# Patient Record
Sex: Female | Born: 1945
Health system: Southern US, Community
[De-identification: ages and names within clinical notes are randomized; demographics above are authoritative.]

## PROBLEM LIST (undated history)

## (undated) DIAGNOSIS — Z85828 Personal history of other malignant neoplasm of skin: Secondary | ICD-10-CM

## (undated) DIAGNOSIS — J302 Other seasonal allergic rhinitis: Secondary | ICD-10-CM

## (undated) DIAGNOSIS — C50919 Malignant neoplasm of unspecified site of unspecified female breast: Secondary | ICD-10-CM

## (undated) DIAGNOSIS — M1990 Unspecified osteoarthritis, unspecified site: Secondary | ICD-10-CM

## (undated) HISTORY — DX: Unspecified osteoarthritis, unspecified site: M19.90

## (undated) HISTORY — DX: Other seasonal allergic rhinitis: J30.2

## (undated) HISTORY — DX: Personal history of other malignant neoplasm of skin: Z85.828

---

## 1991-07-17 DIAGNOSIS — C50919 Malignant neoplasm of unspecified site of unspecified female breast: Secondary | ICD-10-CM

## 1991-07-17 HISTORY — PX: BREAST BIOPSY: SHX20

## 1991-07-17 HISTORY — DX: Malignant neoplasm of unspecified site of unspecified female breast: C50.919

## 2004-05-30 ENCOUNTER — Ambulatory Visit: Payer: Self-pay | Admitting: Unknown Physician Specialty

## 2005-06-15 ENCOUNTER — Ambulatory Visit: Payer: Self-pay | Admitting: Unknown Physician Specialty

## 2006-06-28 ENCOUNTER — Ambulatory Visit: Payer: Self-pay | Admitting: Unknown Physician Specialty

## 2007-06-30 ENCOUNTER — Ambulatory Visit: Payer: Self-pay | Admitting: Unknown Physician Specialty

## 2008-06-30 ENCOUNTER — Ambulatory Visit: Payer: Self-pay | Admitting: Unknown Physician Specialty

## 2009-05-12 ENCOUNTER — Ambulatory Visit: Payer: Self-pay | Admitting: Unknown Physician Specialty

## 2009-07-04 ENCOUNTER — Ambulatory Visit: Payer: Self-pay | Admitting: Unknown Physician Specialty

## 2010-07-05 ENCOUNTER — Ambulatory Visit: Payer: Self-pay | Admitting: Unknown Physician Specialty

## 2011-07-12 ENCOUNTER — Ambulatory Visit: Payer: Self-pay | Admitting: Internal Medicine

## 2012-07-14 ENCOUNTER — Ambulatory Visit: Payer: Self-pay | Admitting: Internal Medicine

## 2012-07-16 HISTORY — PX: MASTECTOMY: SHX3

## 2012-07-18 ENCOUNTER — Ambulatory Visit: Payer: Self-pay | Admitting: Internal Medicine

## 2012-09-01 ENCOUNTER — Ambulatory Visit: Payer: Self-pay | Admitting: Surgery

## 2012-09-11 ENCOUNTER — Ambulatory Visit: Payer: Self-pay | Admitting: Surgery

## 2012-09-11 DIAGNOSIS — R002 Palpitations: Secondary | ICD-10-CM

## 2012-09-18 ENCOUNTER — Ambulatory Visit: Payer: Self-pay | Admitting: Surgery

## 2012-09-22 LAB — PATHOLOGY REPORT

## 2012-10-09 ENCOUNTER — Ambulatory Visit: Payer: Self-pay | Admitting: Internal Medicine

## 2012-10-14 ENCOUNTER — Ambulatory Visit: Payer: Self-pay | Admitting: Internal Medicine

## 2013-07-15 ENCOUNTER — Ambulatory Visit: Payer: Self-pay | Admitting: Surgery

## 2014-07-20 ENCOUNTER — Ambulatory Visit: Payer: Self-pay | Admitting: Family Medicine

## 2014-11-05 NOTE — Op Note (Signed)
PATIENT NAME:  Elizabeth Reeves, Elizabeth Reeves MR#:  [id number] DATE OF BIRTH:  [birth year]  DATE OF PROCEDURE:  09/18/2012  PREOPERATIVE DIAGNOSIS: Carcinoma of the left breast.   POSTOPERATIVE DIAGNOSIS: Carcinoma of the left breast.   PROCEDURE: Left mastectomy.  SURGEON: Rochel Brome, MD     ANESTHESIA: General.   INDICATIONS: This 69 year old female has a past history of carcinoma of the left breast with partial mastectomy and radiation.  She recently had a mammogram depicting microcalcifications in the upper outer quadrant of the left breast.  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   DESCRIPTION OF PROCEDURE: The patient was placed on the operating table in the supine position under general anesthesia. The left arm was placed on the lateral arm support. The left breast was repaired with ChloraPrep, draped in sterile manner.   Elliptical excision was made, carried above and below the breast oriented slightly oblique and transversely. The skin flaps were retracted with silk sutures. The skin and subcutaneous flaps were raised superiorly, medially, inferiorly and laterally, and the breast was elevated off the underlying deep fascia with the use of electrocautery.  It is noted that one bleeding point was suture ligated with 3-0 chromic. Dissection was carried out to include the axillary tail. There was no grossly palpable mass within the axilla. The lateral end of the skin ellipse was tagged with a stitch, and the specimen was submitted for pathology. The wound was inspected, hemostasis was intact.  Two Blake drains were inserted through separate inferior stab wounds, one placed along the anterior chest wall and one extended up into the axilla. These were sutured to the skin with 3-0 nylon. Next, the incision was closed with a running 4-0 Monocryl subcuticular suture and Dermabond. Tegaderm was placed over the entrance point of the drains, and also  the drains were secured with benzoin and 2-inch silk tape. The drains were activated.  There was only scant serous drainage.   The patient tolerated the procedure satisfactorily and was then prepared for transfer to the recovery room.    ____________________________ Lenna Sciara. Rochel Brome, MD jws:cb D: 09/18/2012 09:31:44 ET T: 09/18/2012 09:46:48 ET JOB#: [id number]  cc: Loreli Dollar, MD, <Dictator> Loreli Dollar MD ELECTRONICALLY SIGNED 09/20/2012 14:48

## 2015-07-27 ENCOUNTER — Other Ambulatory Visit: Payer: Self-pay | Admitting: Surgery

## 2015-07-27 DIAGNOSIS — Z1231 Encounter for screening mammogram for malignant neoplasm of breast: Secondary | ICD-10-CM

## 2015-08-11 ENCOUNTER — Ambulatory Visit
Admission: RE | Admit: 2015-08-11 | Discharge: 2015-08-11 | Disposition: A | Discharge status: Home / Self Care | Payer: PPO | Source: Ambulatory Visit | Attending: Surgery | Admitting: Surgery

## 2015-08-11 DIAGNOSIS — Z1231 Encounter for screening mammogram for malignant neoplasm of breast: Secondary | ICD-10-CM | POA: Diagnosis not present

## 2015-08-11 DIAGNOSIS — Z9012 Acquired absence of left breast and nipple: Secondary | ICD-10-CM | POA: Insufficient documentation

## 2015-08-11 HISTORY — DX: Malignant neoplasm of unspecified site of unspecified female breast: C50.919

## 2015-08-22 DIAGNOSIS — Z853 Personal history of malignant neoplasm of breast: Secondary | ICD-10-CM | POA: Diagnosis not present

## 2015-10-27 DIAGNOSIS — Z4432 Encounter for fitting and adjustment of external left breast prosthesis: Secondary | ICD-10-CM | POA: Diagnosis not present

## 2015-10-27 DIAGNOSIS — C50112 Malignant neoplasm of central portion of left female breast: Secondary | ICD-10-CM | POA: Diagnosis not present

## 2015-12-23 DIAGNOSIS — C50112 Malignant neoplasm of central portion of left female breast: Secondary | ICD-10-CM | POA: Diagnosis not present

## 2015-12-23 DIAGNOSIS — Z4432 Encounter for fitting and adjustment of external left breast prosthesis: Secondary | ICD-10-CM | POA: Diagnosis not present

## 2016-01-06 DIAGNOSIS — C50112 Malignant neoplasm of central portion of left female breast: Secondary | ICD-10-CM | POA: Diagnosis not present

## 2016-01-06 DIAGNOSIS — Z4432 Encounter for fitting and adjustment of external left breast prosthesis: Secondary | ICD-10-CM | POA: Diagnosis not present

## 2016-05-18 DIAGNOSIS — E7800 Pure hypercholesterolemia, unspecified: Secondary | ICD-10-CM | POA: Diagnosis not present

## 2016-05-18 DIAGNOSIS — Z23 Encounter for immunization: Secondary | ICD-10-CM | POA: Diagnosis not present

## 2016-05-18 DIAGNOSIS — Z0000 Encounter for general adult medical examination without abnormal findings: Secondary | ICD-10-CM | POA: Diagnosis not present

## 2016-05-18 DIAGNOSIS — Z79899 Other long term (current) drug therapy: Secondary | ICD-10-CM | POA: Diagnosis not present

## 2016-05-21 DIAGNOSIS — I8393 Asymptomatic varicose veins of bilateral lower extremities: Secondary | ICD-10-CM | POA: Diagnosis not present

## 2016-05-21 DIAGNOSIS — D1800 Hemangioma unspecified site: Secondary | ICD-10-CM | POA: Diagnosis not present

## 2016-05-21 DIAGNOSIS — Z85828 Personal history of other malignant neoplasm of skin: Secondary | ICD-10-CM | POA: Diagnosis not present

## 2016-05-21 DIAGNOSIS — D229 Melanocytic nevi, unspecified: Secondary | ICD-10-CM | POA: Diagnosis not present

## 2016-05-21 DIAGNOSIS — L821 Other seborrheic keratosis: Secondary | ICD-10-CM | POA: Diagnosis not present

## 2016-05-21 DIAGNOSIS — L570 Actinic keratosis: Secondary | ICD-10-CM | POA: Diagnosis not present

## 2016-05-21 DIAGNOSIS — Z1283 Encounter for screening for malignant neoplasm of skin: Secondary | ICD-10-CM | POA: Diagnosis not present

## 2016-05-21 DIAGNOSIS — L820 Inflamed seborrheic keratosis: Secondary | ICD-10-CM | POA: Diagnosis not present

## 2016-05-21 DIAGNOSIS — L578 Other skin changes due to chronic exposure to nonionizing radiation: Secondary | ICD-10-CM | POA: Diagnosis not present

## 2016-05-21 DIAGNOSIS — L918 Other hypertrophic disorders of the skin: Secondary | ICD-10-CM | POA: Diagnosis not present

## 2016-05-21 DIAGNOSIS — L814 Other melanin hyperpigmentation: Secondary | ICD-10-CM | POA: Diagnosis not present

## 2016-05-31 DIAGNOSIS — J040 Acute laryngitis: Secondary | ICD-10-CM | POA: Diagnosis not present

## 2016-07-23 ENCOUNTER — Other Ambulatory Visit: Payer: Self-pay | Admitting: Surgery

## 2016-07-23 DIAGNOSIS — Z1231 Encounter for screening mammogram for malignant neoplasm of breast: Secondary | ICD-10-CM

## 2016-07-30 DIAGNOSIS — H2513 Age-related nuclear cataract, bilateral: Secondary | ICD-10-CM | POA: Diagnosis not present

## 2016-08-23 ENCOUNTER — Ambulatory Visit: Payer: Medicare Other

## 2016-08-24 ENCOUNTER — Other Ambulatory Visit: Payer: Self-pay | Admitting: Surgery

## 2016-08-24 ENCOUNTER — Ambulatory Visit
Admission: RE | Admit: 2016-08-24 | Discharge: 2016-08-24 | Disposition: A | Discharge status: Home / Self Care | Payer: PPO | Source: Ambulatory Visit | Attending: Surgery | Admitting: Surgery

## 2016-08-24 DIAGNOSIS — Z1231 Encounter for screening mammogram for malignant neoplasm of breast: Secondary | ICD-10-CM | POA: Insufficient documentation

## 2016-09-17 DIAGNOSIS — Z853 Personal history of malignant neoplasm of breast: Secondary | ICD-10-CM | POA: Diagnosis not present

## 2016-11-29 DIAGNOSIS — Z853 Personal history of malignant neoplasm of breast: Secondary | ICD-10-CM | POA: Diagnosis not present

## 2016-11-29 DIAGNOSIS — R102 Pelvic and perineal pain: Secondary | ICD-10-CM | POA: Diagnosis not present

## 2016-12-25 DIAGNOSIS — R102 Pelvic and perineal pain: Secondary | ICD-10-CM | POA: Diagnosis not present

## 2016-12-25 DIAGNOSIS — Z859 Personal history of malignant neoplasm, unspecified: Secondary | ICD-10-CM | POA: Diagnosis not present

## 2016-12-25 DIAGNOSIS — Z853 Personal history of malignant neoplasm of breast: Secondary | ICD-10-CM | POA: Diagnosis not present

## 2017-01-14 DIAGNOSIS — Z853 Personal history of malignant neoplasm of breast: Secondary | ICD-10-CM | POA: Diagnosis not present

## 2017-01-14 DIAGNOSIS — M79672 Pain in left foot: Secondary | ICD-10-CM | POA: Diagnosis not present

## 2017-01-14 DIAGNOSIS — E7800 Pure hypercholesterolemia, unspecified: Secondary | ICD-10-CM | POA: Diagnosis not present

## 2017-01-14 DIAGNOSIS — M79671 Pain in right foot: Secondary | ICD-10-CM | POA: Diagnosis not present

## 2017-01-14 DIAGNOSIS — R030 Elevated blood-pressure reading, without diagnosis of hypertension: Secondary | ICD-10-CM | POA: Diagnosis not present

## 2017-01-14 DIAGNOSIS — R102 Pelvic and perineal pain: Secondary | ICD-10-CM | POA: Diagnosis not present

## 2017-01-23 DIAGNOSIS — M2012 Hallux valgus (acquired), left foot: Secondary | ICD-10-CM | POA: Diagnosis not present

## 2017-01-23 DIAGNOSIS — M2042 Other hammer toe(s) (acquired), left foot: Secondary | ICD-10-CM | POA: Diagnosis not present

## 2017-06-03 DIAGNOSIS — D229 Melanocytic nevi, unspecified: Secondary | ICD-10-CM | POA: Diagnosis not present

## 2017-06-03 DIAGNOSIS — L814 Other melanin hyperpigmentation: Secondary | ICD-10-CM | POA: Diagnosis not present

## 2017-06-03 DIAGNOSIS — Z1283 Encounter for screening for malignant neoplasm of skin: Secondary | ICD-10-CM | POA: Diagnosis not present

## 2017-06-03 DIAGNOSIS — Z85828 Personal history of other malignant neoplasm of skin: Secondary | ICD-10-CM | POA: Diagnosis not present

## 2017-06-03 DIAGNOSIS — L821 Other seborrheic keratosis: Secondary | ICD-10-CM | POA: Diagnosis not present

## 2017-06-03 DIAGNOSIS — D1800 Hemangioma unspecified site: Secondary | ICD-10-CM | POA: Diagnosis not present

## 2017-06-03 DIAGNOSIS — L859 Epidermal thickening, unspecified: Secondary | ICD-10-CM | POA: Diagnosis not present

## 2017-06-03 DIAGNOSIS — I8393 Asymptomatic varicose veins of bilateral lower extremities: Secondary | ICD-10-CM | POA: Diagnosis not present

## 2017-06-03 DIAGNOSIS — L308 Other specified dermatitis: Secondary | ICD-10-CM | POA: Diagnosis not present

## 2017-07-15 ENCOUNTER — Other Ambulatory Visit: Payer: Self-pay | Admitting: Family Medicine

## 2017-07-15 DIAGNOSIS — Z1231 Encounter for screening mammogram for malignant neoplasm of breast: Secondary | ICD-10-CM

## 2017-08-27 ENCOUNTER — Ambulatory Visit
Admission: RE | Admit: 2017-08-27 | Discharge: 2017-08-27 | Disposition: A | Discharge status: Home / Self Care | Payer: PPO | Source: Ambulatory Visit | Attending: Family Medicine | Admitting: Family Medicine

## 2017-08-27 DIAGNOSIS — Z1231 Encounter for screening mammogram for malignant neoplasm of breast: Secondary | ICD-10-CM | POA: Insufficient documentation

## 2017-08-27 DIAGNOSIS — Z79899 Other long term (current) drug therapy: Secondary | ICD-10-CM | POA: Diagnosis not present

## 2017-08-27 DIAGNOSIS — E7800 Pure hypercholesterolemia, unspecified: Secondary | ICD-10-CM | POA: Diagnosis not present

## 2017-09-02 DIAGNOSIS — Z79899 Other long term (current) drug therapy: Secondary | ICD-10-CM | POA: Diagnosis not present

## 2017-09-02 DIAGNOSIS — Z853 Personal history of malignant neoplasm of breast: Secondary | ICD-10-CM | POA: Diagnosis not present

## 2017-09-02 DIAGNOSIS — Z0000 Encounter for general adult medical examination without abnormal findings: Secondary | ICD-10-CM | POA: Diagnosis not present

## 2017-09-02 DIAGNOSIS — E7800 Pure hypercholesterolemia, unspecified: Secondary | ICD-10-CM | POA: Diagnosis not present

## 2018-01-06 DIAGNOSIS — C50112 Malignant neoplasm of central portion of left female breast: Secondary | ICD-10-CM | POA: Diagnosis not present

## 2018-01-06 DIAGNOSIS — Z4432 Encounter for fitting and adjustment of external left breast prosthesis: Secondary | ICD-10-CM | POA: Diagnosis not present

## 2018-03-11 DIAGNOSIS — Z853 Personal history of malignant neoplasm of breast: Secondary | ICD-10-CM | POA: Diagnosis not present

## 2018-03-11 DIAGNOSIS — E7800 Pure hypercholesterolemia, unspecified: Secondary | ICD-10-CM | POA: Diagnosis not present

## 2018-03-11 DIAGNOSIS — F419 Anxiety disorder, unspecified: Secondary | ICD-10-CM | POA: Diagnosis not present

## 2018-03-11 DIAGNOSIS — Z79899 Other long term (current) drug therapy: Secondary | ICD-10-CM | POA: Diagnosis not present

## 2018-03-14 DIAGNOSIS — Z79899 Other long term (current) drug therapy: Secondary | ICD-10-CM | POA: Diagnosis not present

## 2018-03-14 DIAGNOSIS — E7800 Pure hypercholesterolemia, unspecified: Secondary | ICD-10-CM | POA: Diagnosis not present

## 2018-07-14 DIAGNOSIS — L300 Nummular dermatitis: Secondary | ICD-10-CM | POA: Diagnosis not present

## 2018-07-14 DIAGNOSIS — Z1283 Encounter for screening for malignant neoplasm of skin: Secondary | ICD-10-CM | POA: Diagnosis not present

## 2018-07-14 DIAGNOSIS — L578 Other skin changes due to chronic exposure to nonionizing radiation: Secondary | ICD-10-CM | POA: Diagnosis not present

## 2018-07-14 DIAGNOSIS — L821 Other seborrheic keratosis: Secondary | ICD-10-CM | POA: Diagnosis not present

## 2018-07-14 DIAGNOSIS — D1800 Hemangioma unspecified site: Secondary | ICD-10-CM | POA: Diagnosis not present

## 2018-07-14 DIAGNOSIS — I781 Nevus, non-neoplastic: Secondary | ICD-10-CM | POA: Diagnosis not present

## 2018-07-14 DIAGNOSIS — D225 Melanocytic nevi of trunk: Secondary | ICD-10-CM | POA: Diagnosis not present

## 2018-07-14 DIAGNOSIS — L820 Inflamed seborrheic keratosis: Secondary | ICD-10-CM | POA: Diagnosis not present

## 2018-07-14 DIAGNOSIS — Z85828 Personal history of other malignant neoplasm of skin: Secondary | ICD-10-CM | POA: Diagnosis not present

## 2018-07-14 DIAGNOSIS — L812 Freckles: Secondary | ICD-10-CM | POA: Diagnosis not present

## 2018-07-14 DIAGNOSIS — I8393 Asymptomatic varicose veins of bilateral lower extremities: Secondary | ICD-10-CM | POA: Diagnosis not present

## 2018-07-21 ENCOUNTER — Other Ambulatory Visit: Payer: Self-pay | Admitting: Family Medicine

## 2018-07-21 DIAGNOSIS — Z1231 Encounter for screening mammogram for malignant neoplasm of breast: Secondary | ICD-10-CM

## 2018-08-28 ENCOUNTER — Ambulatory Visit
Admission: RE | Admit: 2018-08-28 | Discharge: 2018-08-28 | Disposition: A | Discharge status: Home / Self Care | Payer: PPO | Source: Ambulatory Visit | Attending: Family Medicine | Admitting: Family Medicine

## 2018-08-28 DIAGNOSIS — Z1231 Encounter for screening mammogram for malignant neoplasm of breast: Secondary | ICD-10-CM

## 2018-09-03 DIAGNOSIS — H2513 Age-related nuclear cataract, bilateral: Secondary | ICD-10-CM | POA: Diagnosis not present

## 2018-09-11 DIAGNOSIS — E7800 Pure hypercholesterolemia, unspecified: Secondary | ICD-10-CM | POA: Diagnosis not present

## 2018-09-11 DIAGNOSIS — Z79899 Other long term (current) drug therapy: Secondary | ICD-10-CM | POA: Diagnosis not present

## 2018-09-16 DIAGNOSIS — Z780 Asymptomatic menopausal state: Secondary | ICD-10-CM | POA: Diagnosis not present

## 2018-09-16 DIAGNOSIS — Z0000 Encounter for general adult medical examination without abnormal findings: Secondary | ICD-10-CM | POA: Diagnosis not present

## 2018-11-11 DIAGNOSIS — J029 Acute pharyngitis, unspecified: Secondary | ICD-10-CM | POA: Diagnosis not present

## 2019-03-03 DIAGNOSIS — M810 Age-related osteoporosis without current pathological fracture: Secondary | ICD-10-CM | POA: Diagnosis not present

## 2019-03-13 DIAGNOSIS — Z79899 Other long term (current) drug therapy: Secondary | ICD-10-CM | POA: Diagnosis not present

## 2019-03-13 DIAGNOSIS — E7800 Pure hypercholesterolemia, unspecified: Secondary | ICD-10-CM | POA: Diagnosis not present

## 2019-03-18 DIAGNOSIS — K5792 Diverticulitis of intestine, part unspecified, without perforation or abscess without bleeding: Secondary | ICD-10-CM | POA: Diagnosis not present

## 2019-03-18 DIAGNOSIS — F419 Anxiety disorder, unspecified: Secondary | ICD-10-CM | POA: Diagnosis not present

## 2019-03-18 DIAGNOSIS — E7800 Pure hypercholesterolemia, unspecified: Secondary | ICD-10-CM | POA: Diagnosis not present

## 2019-03-18 DIAGNOSIS — Z79899 Other long term (current) drug therapy: Secondary | ICD-10-CM | POA: Diagnosis not present

## 2019-03-18 DIAGNOSIS — C50912 Malignant neoplasm of unspecified site of left female breast: Secondary | ICD-10-CM | POA: Diagnosis not present

## 2019-07-13 DIAGNOSIS — D225 Melanocytic nevi of trunk: Secondary | ICD-10-CM | POA: Diagnosis not present

## 2019-07-13 DIAGNOSIS — Z1283 Encounter for screening for malignant neoplasm of skin: Secondary | ICD-10-CM | POA: Diagnosis not present

## 2019-07-13 DIAGNOSIS — Z85828 Personal history of other malignant neoplasm of skin: Secondary | ICD-10-CM | POA: Diagnosis not present

## 2019-07-13 DIAGNOSIS — L820 Inflamed seborrheic keratosis: Secondary | ICD-10-CM | POA: Diagnosis not present

## 2019-07-13 DIAGNOSIS — D485 Neoplasm of uncertain behavior of skin: Secondary | ICD-10-CM | POA: Diagnosis not present

## 2019-07-13 DIAGNOSIS — I8393 Asymptomatic varicose veins of bilateral lower extremities: Secondary | ICD-10-CM | POA: Diagnosis not present

## 2019-07-13 DIAGNOSIS — L308 Other specified dermatitis: Secondary | ICD-10-CM | POA: Diagnosis not present

## 2019-07-13 DIAGNOSIS — L821 Other seborrheic keratosis: Secondary | ICD-10-CM | POA: Diagnosis not present

## 2019-07-13 DIAGNOSIS — D2239 Melanocytic nevi of other parts of face: Secondary | ICD-10-CM | POA: Diagnosis not present

## 2019-07-13 DIAGNOSIS — D1800 Hemangioma unspecified site: Secondary | ICD-10-CM | POA: Diagnosis not present

## 2019-07-13 DIAGNOSIS — L814 Other melanin hyperpigmentation: Secondary | ICD-10-CM | POA: Diagnosis not present

## 2019-07-30 ENCOUNTER — Other Ambulatory Visit: Payer: Self-pay | Admitting: Family Medicine

## 2019-07-30 DIAGNOSIS — Z1231 Encounter for screening mammogram for malignant neoplasm of breast: Secondary | ICD-10-CM

## 2019-08-31 ENCOUNTER — Ambulatory Visit
Admission: RE | Admit: 2019-08-31 | Discharge: 2019-08-31 | Disposition: A | Discharge status: Home / Self Care | Payer: PPO | Source: Ambulatory Visit | Attending: Family Medicine | Admitting: Family Medicine

## 2019-08-31 DIAGNOSIS — Z1231 Encounter for screening mammogram for malignant neoplasm of breast: Secondary | ICD-10-CM | POA: Diagnosis not present

## 2019-09-14 DIAGNOSIS — Z79899 Other long term (current) drug therapy: Secondary | ICD-10-CM | POA: Diagnosis not present

## 2019-09-14 DIAGNOSIS — E7800 Pure hypercholesterolemia, unspecified: Secondary | ICD-10-CM | POA: Diagnosis not present

## 2019-09-18 DIAGNOSIS — R030 Elevated blood-pressure reading, without diagnosis of hypertension: Secondary | ICD-10-CM | POA: Diagnosis not present

## 2019-09-18 DIAGNOSIS — Z1331 Encounter for screening for depression: Secondary | ICD-10-CM | POA: Diagnosis not present

## 2019-09-18 DIAGNOSIS — Z0000 Encounter for general adult medical examination without abnormal findings: Secondary | ICD-10-CM | POA: Diagnosis not present

## 2020-04-01 DIAGNOSIS — E7800 Pure hypercholesterolemia, unspecified: Secondary | ICD-10-CM | POA: Diagnosis not present

## 2020-04-01 DIAGNOSIS — Z79899 Other long term (current) drug therapy: Secondary | ICD-10-CM | POA: Diagnosis not present

## 2020-04-08 DIAGNOSIS — F419 Anxiety disorder, unspecified: Secondary | ICD-10-CM | POA: Diagnosis not present

## 2020-04-08 DIAGNOSIS — R739 Hyperglycemia, unspecified: Secondary | ICD-10-CM | POA: Diagnosis not present

## 2020-04-08 DIAGNOSIS — Z79899 Other long term (current) drug therapy: Secondary | ICD-10-CM | POA: Diagnosis not present

## 2020-04-08 DIAGNOSIS — C50912 Malignant neoplasm of unspecified site of left female breast: Secondary | ICD-10-CM | POA: Diagnosis not present

## 2020-04-08 DIAGNOSIS — E7800 Pure hypercholesterolemia, unspecified: Secondary | ICD-10-CM | POA: Diagnosis not present

## 2020-04-08 DIAGNOSIS — K219 Gastro-esophageal reflux disease without esophagitis: Secondary | ICD-10-CM | POA: Diagnosis not present

## 2020-08-08 ENCOUNTER — Other Ambulatory Visit: Payer: Self-pay | Admitting: Family Medicine

## 2020-08-08 DIAGNOSIS — Z1231 Encounter for screening mammogram for malignant neoplasm of breast: Secondary | ICD-10-CM

## 2020-09-05 ENCOUNTER — Other Ambulatory Visit: Payer: Self-pay

## 2020-09-05 ENCOUNTER — Ambulatory Visit
Admission: RE | Admit: 2020-09-05 | Discharge: 2020-09-05 | Disposition: A | Discharge status: Home / Self Care | Payer: Medicare Other | Source: Ambulatory Visit | Attending: Family Medicine | Admitting: Family Medicine

## 2020-09-05 DIAGNOSIS — Z1231 Encounter for screening mammogram for malignant neoplasm of breast: Secondary | ICD-10-CM | POA: Diagnosis not present

## 2020-10-01 LAB — EXTERNAL GENERIC LAB PROCEDURE: COLOGUARD: NEGATIVE

## 2020-10-01 LAB — COLOGUARD: COLOGUARD: NEGATIVE

## 2021-08-02 ENCOUNTER — Other Ambulatory Visit: Payer: Self-pay | Admitting: Family Medicine

## 2021-08-02 DIAGNOSIS — Z1231 Encounter for screening mammogram for malignant neoplasm of breast: Secondary | ICD-10-CM

## 2021-09-06 ENCOUNTER — Ambulatory Visit
Admission: RE | Admit: 2021-09-06 | Discharge: 2021-09-06 | Disposition: A | Discharge status: Home / Self Care | Payer: Medicare Other | Source: Ambulatory Visit | Attending: Family Medicine | Admitting: Family Medicine

## 2021-09-06 ENCOUNTER — Other Ambulatory Visit: Payer: Self-pay

## 2021-09-06 DIAGNOSIS — Z1231 Encounter for screening mammogram for malignant neoplasm of breast: Secondary | ICD-10-CM | POA: Diagnosis not present

## 2022-06-02 IMAGING — MG MM DIGITAL SCREENING UNILAT*R* W/ TOMO W/ CAD
4 series · 4 of 12 positions shown · non-contrast
Comparison: Previous exam(s).

CLINICAL DATA: Screening.

EXAM:
DIGITAL SCREENING UNILATERAL RIGHT MAMMOGRAM WITH CAD AND
TOMOSYNTHESIS
TECHNIQUE: Right screening digital craniocaudal and mediolateral oblique
mammograms were obtained. Right screening digital breast
tomosynthesis was performed. The images were evaluated with
computer-aided detection.

[R CC synth-2D]
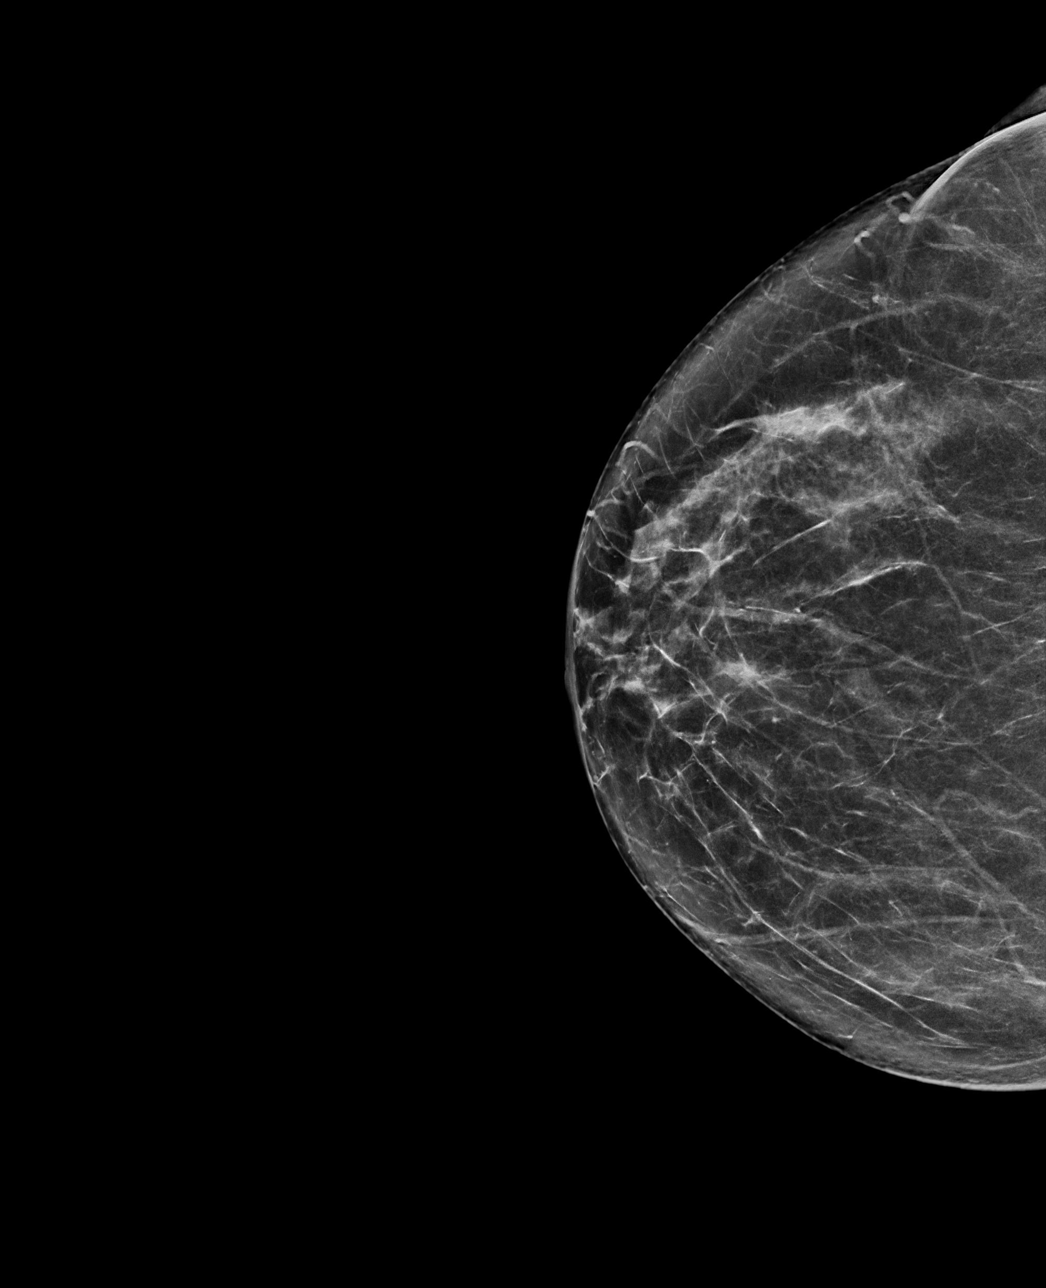

[R MLO synth-2D]
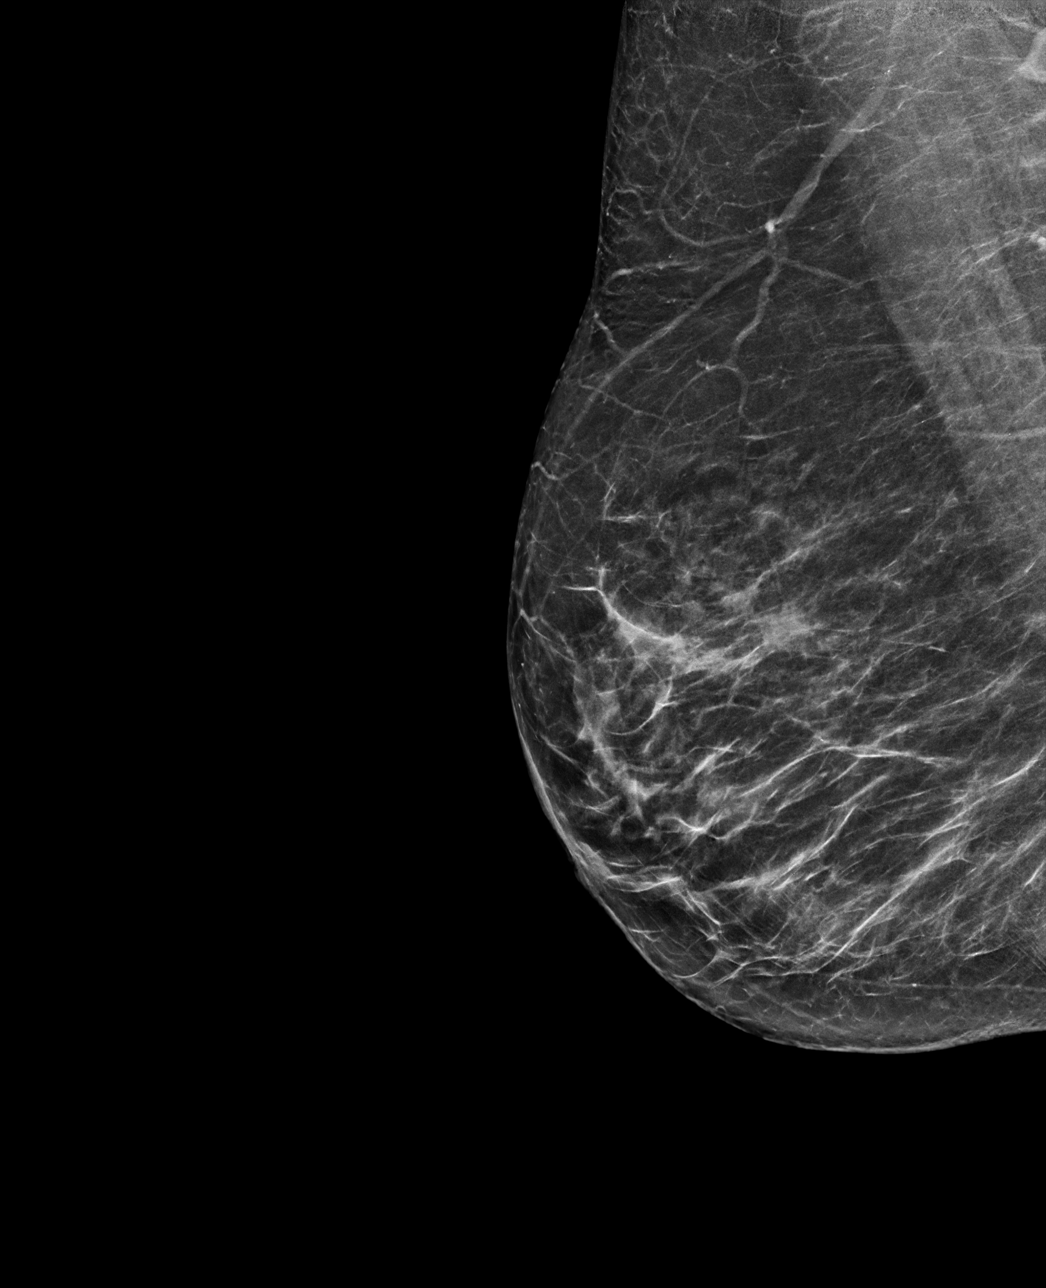

[R CC tomo · tomo slice 37/73.0]
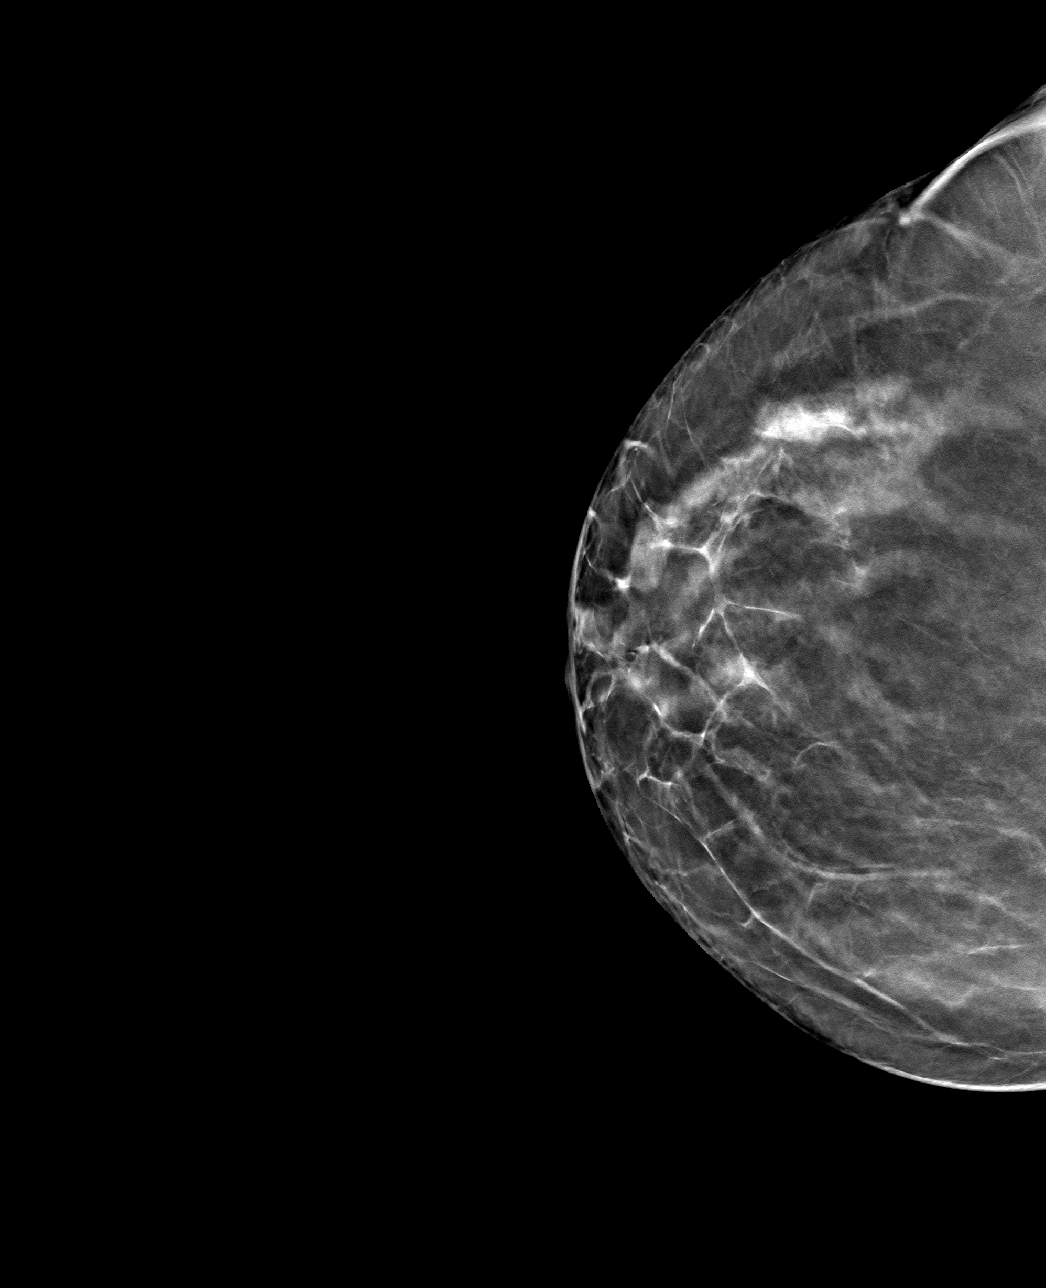

[R MLO tomo · tomo slice 37/73.0]
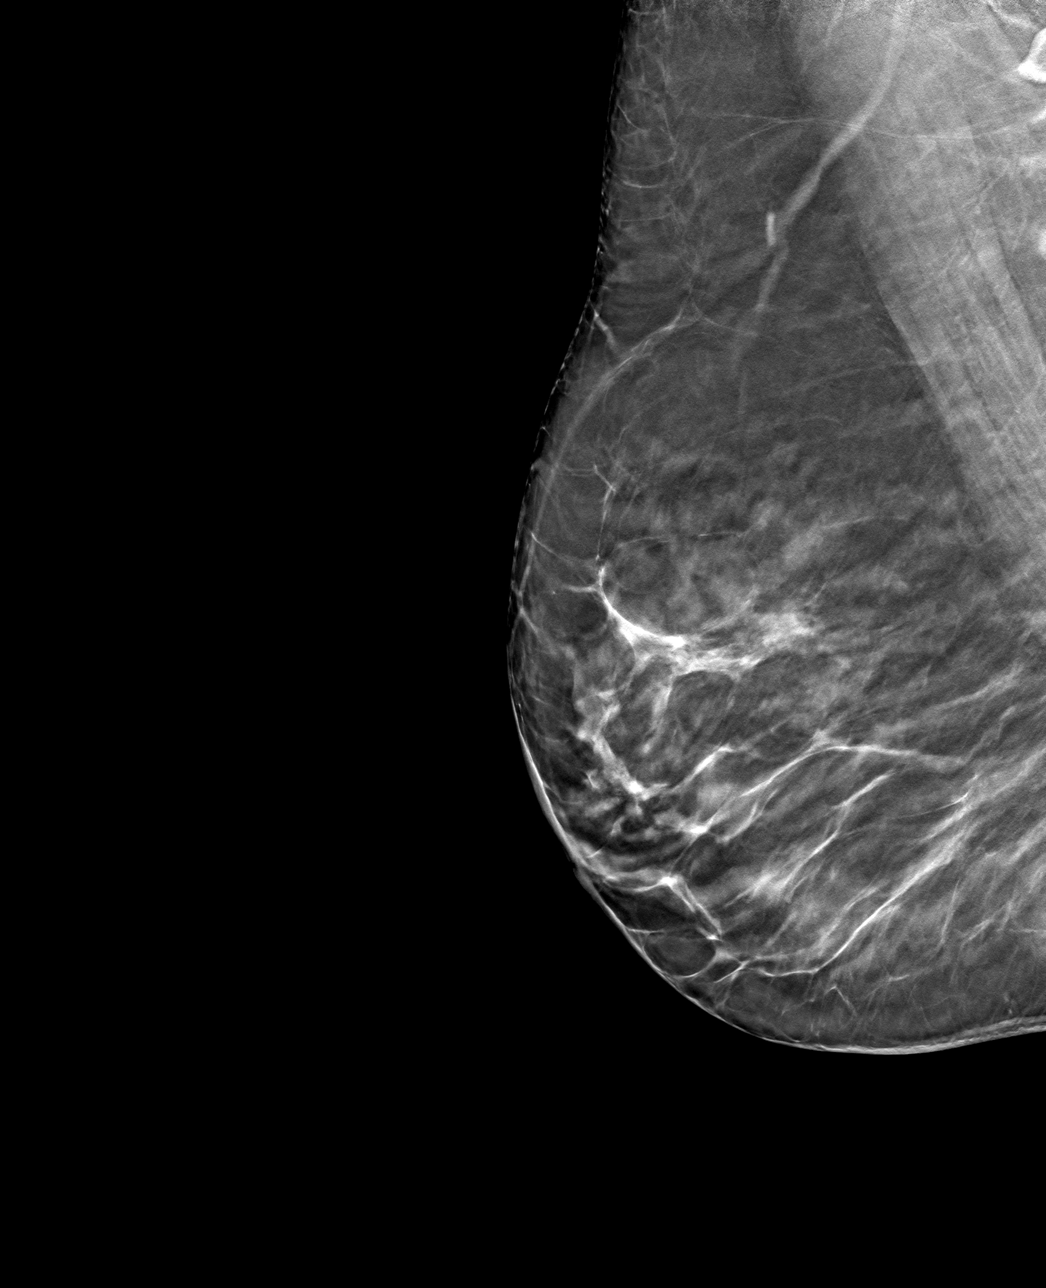

[4 of 12 positions shown; findings below may reference images not displayed]

ACR Breast Density Category b: There are scattered areas of
fibroglandular density.
FINDINGS: The patient has had a left mastectomy. There are no findings
suspicious for malignancy.
IMPRESSION: No mammographic evidence of malignancy. A result letter of this
screening mammogram will be mailed directly to the patient.

RECOMMENDATION:
Screening mammogram in one year.  (Code:NT-E-EGT)

BI-RADS CATEGORY  1: Negative.

## 2022-07-27 ENCOUNTER — Other Ambulatory Visit: Payer: Self-pay | Admitting: Family Medicine

## 2022-07-27 DIAGNOSIS — Z1231 Encounter for screening mammogram for malignant neoplasm of breast: Secondary | ICD-10-CM

## 2022-09-11 ENCOUNTER — Ambulatory Visit
Admission: RE | Admit: 2022-09-11 | Discharge: 2022-09-11 | Disposition: A | Discharge status: Home / Self Care | Payer: Medicare Other | Source: Ambulatory Visit | Attending: Family Medicine | Admitting: Family Medicine

## 2022-09-11 DIAGNOSIS — Z1231 Encounter for screening mammogram for malignant neoplasm of breast: Secondary | ICD-10-CM | POA: Diagnosis present

## 2023-08-06 ENCOUNTER — Other Ambulatory Visit: Payer: Self-pay | Admitting: Family Medicine

## 2023-08-06 DIAGNOSIS — Z1231 Encounter for screening mammogram for malignant neoplasm of breast: Secondary | ICD-10-CM

## 2023-09-17 ENCOUNTER — Ambulatory Visit
Admission: RE | Admit: 2023-09-17 | Discharge: 2023-09-17 | Disposition: A | Discharge status: Home / Self Care | Payer: Medicare Other | Source: Ambulatory Visit | Attending: Family Medicine | Admitting: Family Medicine

## 2023-09-17 DIAGNOSIS — Z1231 Encounter for screening mammogram for malignant neoplasm of breast: Secondary | ICD-10-CM | POA: Diagnosis present

## 2023-11-06 LAB — COLOGUARD: COLOGUARD: NEGATIVE

## 2024-03-18 ENCOUNTER — Ambulatory Visit (INDEPENDENT_AMBULATORY_CARE_PROVIDER_SITE_OTHER)

## 2024-03-18 DIAGNOSIS — L814 Other melanin hyperpigmentation: Secondary | ICD-10-CM

## 2024-03-18 DIAGNOSIS — Z85828 Personal history of other malignant neoplasm of skin: Secondary | ICD-10-CM

## 2024-03-18 DIAGNOSIS — L821 Other seborrheic keratosis: Secondary | ICD-10-CM | POA: Diagnosis not present

## 2024-03-18 DIAGNOSIS — L578 Other skin changes due to chronic exposure to nonionizing radiation: Secondary | ICD-10-CM

## 2024-03-18 DIAGNOSIS — D1801 Hemangioma of skin and subcutaneous tissue: Secondary | ICD-10-CM | POA: Diagnosis not present

## 2024-03-18 DIAGNOSIS — C4490 Unspecified malignant neoplasm of skin, unspecified: Secondary | ICD-10-CM

## 2024-03-18 DIAGNOSIS — W908XXA Exposure to other nonionizing radiation, initial encounter: Secondary | ICD-10-CM | POA: Diagnosis not present

## 2024-03-18 DIAGNOSIS — L820 Inflamed seborrheic keratosis: Secondary | ICD-10-CM

## 2024-03-18 DIAGNOSIS — I781 Nevus, non-neoplastic: Secondary | ICD-10-CM

## 2024-03-18 DIAGNOSIS — D229 Melanocytic nevi, unspecified: Secondary | ICD-10-CM

## 2024-03-18 DIAGNOSIS — L570 Actinic keratosis: Secondary | ICD-10-CM

## 2024-03-18 DIAGNOSIS — Z1283 Encounter for screening for malignant neoplasm of skin: Secondary | ICD-10-CM

## 2024-03-18 NOTE — Patient Instructions (Addendum)
 Cryotherapy Aftercare  Wash gently with soap and water everyday.   Apply Vaseline Jelly daily until healed.    Melanoma ABCDEs  Melanoma is the most dangerous type of skin cancer, and is the leading cause of death from skin disease.  You are more likely to develop melanoma if you: Have light-colored skin, light-colored eyes, or red or blond hair Spend a lot of time in the sun Tan regularly, either outdoors or in a tanning bed Have had blistering sunburns, especially during childhood Have a close family member who has had a melanoma Have atypical moles or large birthmarks  Early detection of melanoma is key since treatment is typically straightforward and cure rates are extremely high if we catch it early.   The first sign of melanoma is often a change in a mole or a new dark spot.  The ABCDE system is a way of remembering the signs of melanoma.  A for asymmetry:  The two halves do not match. B for border:  The edges of the growth are irregular. C for color:  A mixture of colors are present instead of an even brown color. D for diameter:  Melanomas are usually (but not always) greater than 6mm - the size of a pencil eraser. E for evolution:  The spot keeps changing in size, shape, and color.  Please check your skin once per month between visits. You can use a small mirror in front and a large mirror behind you to keep an eye on the back side or your body.   If you see any new or changing lesions before your next follow-up, please call to schedule a visit.  Please continue daily skin protection including broad spectrum sunscreen SPF 30+ to sun-exposed areas, reapplying every 2 hours as needed when you're outdoors.   Staying in the shade or wearing long sleeves, sun glasses (UVA+UVB protection) and wide brim hats (4-inch brim around the entire circumference of the hat) are also recommended for sun protection.      Skin Care and Sun Protection  Your skin plays an important role in  keeping the entire body healthy. Below are some tips on how to try and maximize skin health from the outside in.  Bathing  Bathe in mildly warm water every 1 to 2 days, followed by light drying and an application of a thick moisturizer cream or ointment, preferably one that comes in a tub.  Recommended body soaps/washes: - Cerave Hydrating Cleanser Bar - Dove Sensitive Skin Fragrance Free Beauty Bar - Aveeno Active Naturals Skin Relief Body Wash, Fragrance Free - Free & Clear (vanicream) liquid cleanser  Moisturizer  Body moisturizer: Apply a moisturizer throughout the day and after bathing.  When you moisturize after bathing, this locks in the moisture.  This can lead to softer and smoother skin.  Body moisturizers come in ointments, creams, and lotions.  If you have dry skin, we recommend the use of ointments or creams rather than lotions.  In other words, something you scoop out of a jar rather than squirted out.  Ointments and creams are thicker and thus provide better moisturization.    Recommended creams for all over: - Vanicream cream - CeraVe Moisturizing Cream - Eucerin Original Healing Soothing Repair Cream  Recommended ointments: greasy, but do the best job at moisturization - Plain Vaseline (petroleum jelly) - CeraVe Healing ointment - Aquaphor Healing ointment  Face moisturizers: For your face, look for something that is labeled as non-comedogenic (won't clog pores) and oil-free.  Your moisturizer for the day should have SPF 30 or higher in it as well, but your moisturizer for night can be without SPF. Some good examples are: - CeraVe Moisturizing Cream (can be used as a face moisturizer) - La Roche-Posay Toleriane Double Repair Facial Moisturizer with SPF 30 (my favorite for day time) - CeraVe AM (has SPF 30) - CeraVe PM  Sunscreen  Who needs sunscreen? Everyone. Sunscreen use can help prevent skin cancer by protecting you from the sun's harmful ultraviolet rays. Anyone  can get skin cancer, regardless of age, gender or race. In fact, it is estimated that one in five Americans will develop skin cancer in their lifetime.  Sunscreen alone cannot fully protect you. In addition to wearing sunscreen, dermatologists recommend taking the following steps to protect your skin and find skin cancer early:  Seek shade when appropriate, remembering that the sun's rays are strongest between 10 a.m. and 2 p.m. If your shadow is shorter than you are, seek shade. Dress to protect yourself from the sun by wearing a lightweight long-sleeved shirt, pants, a wide-brimmed hat and sunglasses, when possible.  Use extra caution near water, snow and sand as they reflect the damaging rays of the sun, which can increase your chance of sunburn.  Get vitamin D  safely through a healthy diet that may include vitamin supplements. Don't seek the sun. Avoid tanning beds. Ultraviolet light from the sun and tanning beds can cause skin cancer and wrinkling. If you want to look tan, you may wish to use a self-tanning product, but continue to use sunscreen with it.  When should I use sunscreen? Every day you go outside--even if you're just walking to and from your form of transportation. The sun emits harmful UV rays year-round. Even on cloudy days, up to 80 percent of the sun's harmful UV rays can penetrate your skin. Snow, sand and water increase the need for sunscreen because they reflect the sun's rays.  How much sunscreen should I use, and how often should I apply it? Most people only apply 25-50 percent of the recommended amount of sunscreen. Apply enough sunscreen to cover all exposed skin. Most adults need about 1 ounce -- or enough to fill a shot glass -- to fully cover their body.  Don't forget to apply to the tops of your feet, your neck, your ears and the top of your head. Apply sunscreen to dry skin 15 minutes before going outdoors.  Skin cancer also can form on the lips. To protect your lips,  apply a lip balm or lipstick that contains sunscreen with an SPF of 30 or higher.  When outdoors, reapply sunscreen approximately every two hours, or after swimming or sweating, according to the directions on the bottle.   Broad-spectrum sunscreens protect against both UVA and UVB rays. What is the difference between the rays? Sunlight consists of two types of harmful rays that reach the earth -- UVA rays and UVB rays. Overexposure to either can lead to skin cancer. In addition to causing skin cancer, here's what each of these rays do:  UVA rays (or aging rays) can prematurely age your skin, causing wrinkles and age spots, and can pass through window glass. UVB rays (or burning rays) are the primary cause of sunburn and are blocked by window glass  There is no safe way to tan. Every time you tan, you damage your skin. As this damage builds, you speed up the aging of your skin and increase your risk for  all types of skin cancer.  What is the difference between chemical and physical sunscreens? Chemical sunscreens work like a sponge, absorbing the sun's rays. They contain one or more of the following active ingredients: oxybenzone, avobenzone, octisalate, octocrylene, homosalate and octinoxate. These formulations tend to be easier to rub into the skin without leaving a white residue.   Physical sunscreens work like a shield, sitting sit on the surface of your skin and deflecting the sun's rays. They contain the active ingredients zinc oxide and/or titanium dioxide. Use this sunscreen if you have sensitive skin.   What type of sunscreen should I use? The best type of sunscreen is the one you will use again and again. Just make sure it offers broad-spectrum (UVA and UVB) protection, has an SPF of 30+, and is water-resistant. The kind of sunscreen you use is a matter of personal choice, and may vary depending on the area of the body to be protected. Available sunscreen options include lotions, creams, gels,  ointments, wax sticks and sprays.  Recommended physical sunscreens for face: - Neutrogena Sheer Zinc - Aveeno Positively Mineral Sensitive - CeraVe Hydrating Mineral (also has a tinted version) - La Roche-Posay Anthelios Mineral Face (comes as a cream, lotion, light fluid, and there is also a tinted version).  - EltaMD UV Clear (also has a tinted version)  Recommended physical sunscreens for body: - Neutrogena Sheer Zinc Dry-Touch Sunscreen Sensitive Skin Lotion Broad Spectrum SPF 50 - Aveeno Positively Mineral Sensitive Skin Sunscreen Broad Spectrum SPF 50 - La Roche-Posay Anthelios SPF 50 Mineral Sunscreen - Gentle Lotion - CeraVe Hydrating Mineral Sunscreen SPF 50  Recommended chemical sunscreens for face: - Anthelios UV Correct Face Sunscreen SPF 70 with Niacinamide - Neutrogena Clear Face Oil-Free SPF 50 with Helioplex - Neutrogena Sport Face Oil-Free SPF 70+ with Helioplex - Aveeno Protect + Hydrate Sunscreen For Face SPF 70 - La Roche-Posay Anthelios Light Fluid Sunscreen for Face SPF 60  Recommended chemical sunscreens for body: - Neutrogena Ultra Sheer Dry-Touch Sunscreen SPF 70 - Aveeno Protect + Hydrate Broad Spectrum All-Day Hydration SPF 60 (comes in a big pump) - La Roche-Posay Anthelios Melt-In Milk Sunscreen SPF 60  Recommended UPF Clothing - Coolibar  - Solbari  - Wallaroo hats  - Materials engineer (On [employer])     Due to recent changes in healthcare laws, you may see results of your pathology and/or laboratory studies on MyChart before the doctors have had a chance to review them. We understand that in some cases there may be results that are confusing or concerning to you. Please understand that not all results are received at the same time and often the doctors may need to interpret multiple results in order to provide you with the best plan of care or course of treatment. Therefore, we ask that you please give us  2 business days to thoroughly review all your results before  contacting the office for clarification. Should we see a critical lab result, you will be contacted sooner.   If You Need Anything After Your Visit  If you have any questions or concerns for your doctor, please call our main line at [phone] and press option 4 to reach your doctor's medical assistant. If no one answers, please leave a voicemail as directed and we will return your call as soon as possible. Messages left after 4 pm will be answered the following business day.   You may also send us  a message via MyChart. We typically respond to MyChart messages within  1-2 business days.  For prescription refills, please ask your pharmacy to contact our office. Our fax number is [phone].  If you have an urgent issue when the clinic is closed that cannot wait until the next business day, you can page your doctor at the number below.    Please note that while we do our best to be available for urgent issues outside of office hours, we are not available 24/7.   If you have an urgent issue and are unable to reach us , you may choose to seek medical care at your doctor's office, retail clinic, urgent care center, or emergency room.  If you have a medical emergency, please immediately call 911 or go to the emergency department.  Pager Numbers  - Dr. Hester: [phone]  - Dr. Jackquline: [phone]  - Dr. Claudene: [phone]   - Dr. Raymund: [phone]  In the event of inclement weather, please call our main line at [phone] for an update on the status of any delays or closures.  Dermatology Medication Tips: Please keep the boxes that topical medications come in in order to help keep track of the instructions about where and how to use these. Pharmacies typically print the medication instructions only on the boxes and not directly on the medication tubes.   If your medication is too expensive, please contact our office at [phone] option 4 or send us  a message through  MyChart.   We are unable to tell what your co-pay for medications will be in advance as this is different depending on your insurance coverage. However, we may be able to find a substitute medication at lower cost or fill out paperwork to get insurance to cover a needed medication.   If a prior authorization is required to get your medication covered by your insurance company, please allow us  1-2 business days to complete this process.  Drug prices often vary depending on where the prescription is filled and some pharmacies may offer cheaper prices.  The website www.goodrx.com contains coupons for medications through different pharmacies. The prices here do not account for what the cost may be with help from insurance (it may be cheaper with your insurance), but the website can give you the price if you did not use any insurance.  - You can print the associated coupon and take it with your prescription to the pharmacy.  - You may also stop by our office during regular business hours and pick up a GoodRx coupon card.  - If you need your prescription sent electronically to a different pharmacy, notify our office through [facility] or by phone at [phone] option 4.     Si Usted Necesita Algo Despus de Su Visita  Tambin puede enviarnos un mensaje a travs de Clinical cytogeneticist. Por lo general respondemos a los mensajes de MyChart en el transcurso de 1 a 2 das hbiles.  Para renovar recetas, por favor pida a su farmacia que se ponga en contacto con nuestra oficina. Randi lakes de fax es [place] [phone].  Si tiene un asunto urgente cuando la clnica est cerrada y que no puede esperar hasta el siguiente da hbil, puede llamar/localizar a su doctor(a) al nmero que aparece a continuacin.   Por favor, tenga en cuenta que aunque hacemos todo lo posible para estar disponibles para asuntos urgentes fuera del horario de [place], no estamos disponibles las 24 horas del da, los 7 [address]  [address] de la  [place].   Si tiene un problema urgente y no puede comunicarse  con nosotros, puede optar por buscar atencin mdica  en el consultorio de su doctor(a), en una clnica privada, en un centro de atencin urgente o en una sala de emergencias.  Si tiene Engineer, drilling, por favor llame inmediatamente al 911 o vaya a la sala de emergencias.  Nmeros de bper  - Dr. Hester: [phone]  - Dra. Jackquline: [phone]  - Dr. Claudene: [phone]  - Dra. Kitts: [phone]  En caso de inclemencias del [place], por favor llame a nuestra lnea principal al [phone] para una actualizacin sobre el estado de cualquier retraso o cierre.  Consejos para la medicacin en dermatologa: Por favor, guarde las cajas en las que vienen los medicamentos de uso tpico para ayudarle a seguir las instrucciones sobre dnde y cmo usarlos. Las farmacias generalmente imprimen las instrucciones del medicamento slo en las cajas y no directamente en los tubos del [place].   Si su medicamento es muy caro, por favor, pngase en contacto con landry rieger llamando al [phone] y presione la opcin 4 o envenos un mensaje a travs de Clinical cytogeneticist.   No podemos decirle cul ser su copago por los medicamentos por adelantado ya que esto es diferente dependiendo de la cobertura de su seguro. Sin embargo, es posible que podamos encontrar un medicamento sustituto a Audiological scientist un formulario para que el seguro cubra el medicamento que se considera necesario.   Si se requiere una autorizacin previa para que su compaa de seguros malta su medicamento, por favor permtanos de 1 a 2 das hbiles para completar este proceso.  Los precios de los medicamentos varan con frecuencia dependiendo del Environmental consultant de dnde se surte la receta y alguna farmacias pueden ofrecer precios ms baratos.  El sitio web www.goodrx.com tiene cupones para medicamentos de Health and safety inspector. Los precios aqu no tienen en cuenta lo que  podra costar con la ayuda del seguro (puede ser ms barato con su seguro), pero el sitio web puede darle el precio si no utiliz Tourist information centre manager.  - Puede imprimir el cupn correspondiente y llevarlo con su receta a la farmacia.  - Tambin puede pasar por nuestra oficina durante el horario de atencin regular y Education officer, museum una tarjeta de cupones de GoodRx.  - Si necesita que su receta se enve electrnicamente a una farmacia diferente, informe a nuestra oficina a travs de MyChart de [place] o por telfono llamando al [phone] y presione la opcin 4.

## 2024-03-18 NOTE — Progress Notes (Signed)
 New Patient Visit   Subjective  Elizabeth Reeves is a 78 y.o. female who presents for the following: Skin Cancer Screening and Full Body Skin Exam. Hx of BCCs. Hx of SCC.   The patient presents for Total-Body Skin Exam (TBSE) for skin cancer screening and mole check. The patient has spots, moles and lesions to be evaluated, some may be new or changing and the patient may have concern these could be cancer.   The following portions of the chart were reviewed this encounter and updated as appropriate: medications, allergies, medical history  Review of Systems:  No other skin or systemic complaints except as noted in HPI or Assessment and Plan.  Objective  Well appearing patient in no apparent distress; mood and affect are within normal limits.  A full examination was performed including scalp, head, eyes, ears, nose, lips, neck, chest, axillae, abdomen, back, buttocks, bilateral upper extremities, bilateral lower extremities, hands, feet, fingers, toes, fingernails, and toenails. All findings within normal limits unless otherwise noted below.   - Angioma(s): Scattered red vascular papule(s) on the trunk - Lentigo/lentigines: Scattered pigmented macules that are tan to brown in color and are somewhat non-uniform in shape and concentrated in the sun-exposed areas of the trunk and upper extremities - Nevus/nevi: Scattered well-demarcated, regular, pigmented macule(s) and/or papule(s) on the scattered diffusely - Seborrheic Keratosis(es): Stuck-on appearing keratotic papule(s) on the trunk, some irritated with redness, crusting, edema, and/or partial avulsion - Actinic Elastosis: chronic sun damage: dyspigmentation, telangiectasia, and wrinkling Nevi - well demarcated brown macules  - Dilated blue, purple or red veins at the lower extremities  Nose x1, R cheek x1 (2) Erythematous thin papules/macules with gritty scale.  Right Forearm x1, L post thigh x1, R temple x1 (3) Erythematous keratotic  or waxy stuck-on papule or plaque.  Assessment & Plan   SKIN CANCER SCREENING PERFORMED TODAY.  HISTORY OF BASAL CELL CARCINOMA OF THE SKIN - No evidence of recurrence today - Recommend regular full body skin exams - Recommend daily broad spectrum sunscreen SPF 30+ to sun-exposed areas, reapply every 2 hours as needed.  - Call if any new or changing lesions are noted between office visits  HISTORY OF SQUAMOUS CELL CARCINOMA OF THE SKIN - No evidence of recurrence today - No lymphadenopathy - Recommend regular full body skin exams - Recommend daily broad spectrum sunscreen SPF 30+ to sun-exposed areas, reapply every 2 hours as needed.  - Call if any new or changing lesions are noted between office visits   ACTINIC DAMAGE - Chronic condition, secondary to cumulative UV/sun exposure - diffuse scaly erythematous macules with underlying dyspigmentation - Recommend daily broad spectrum sunscreen SPF 30+ to sun-exposed areas, reapply every 2 hours as needed.  - Staying in the shade or wearing long sleeves, sun glasses (UVA+UVB protection) and wide brim hats (4-inch brim around the entire circumference of the hat) are also recommended for sun protection.  - Call for new or changing lesions.  LENTIGINES, SEBORRHEIC KERATOSES, HEMANGIOMAS - Benign normal skin lesions - Benign-appearing - Call for any changes  MELANOCYTIC NEVI - Tan-brown and/or pink-flesh-colored symmetric macules and papules - Benign appearing on exam today - Observation - Call clinic for new or changing moles - Recommend daily use of broad spectrum spf 30+ sunscreen to sun-exposed areas.   Spider Veins - Reassured - Smaller vessels can be treated by sclerotherapy (a procedure to inject a medicine into the veins to make them disappear) if desired, but the treatment is not  covered by insurance. Larger vessels may be covered if symptomatic and we would refer to vascular surgeon if treatment desired.    AK (ACTINIC  KERATOSIS) (2) Nose x1, R cheek x1 (2) Actinic keratoses are precancerous spots that appear secondary to cumulative UV radiation exposure/sun exposure over time. They are chronic with expected duration over 1 year. A portion of actinic keratoses will progress to squamous cell carcinoma of the skin. It is not possible to reliably predict which spots will progress to skin cancer and so treatment is recommended to prevent development of skin cancer.  Recommend daily broad spectrum sunscreen SPF 30+ to sun-exposed areas, reapply every 2 hours as needed.  Recommend staying in the shade or wearing long sleeves, sun glasses (UVA+UVB protection) and wide brim hats (4-inch brim around the entire circumference of the hat). Call for new or changing lesions. Destruction of lesion - Nose x1, R cheek x1 (2)  Destruction method: cryotherapy   Informed consent: discussed and consent obtained   Lesion destroyed using liquid nitrogen: Yes   Region frozen until ice ball extended beyond lesion: Yes   Outcome: patient tolerated procedure well with no complications   Post-procedure details: wound care instructions given   Additional details:  Prior to procedure, discussed risks of blister formation, small wound, skin dyspigmentation, or rare scar following cryotherapy. Recommend Vaseline ointment to treated areas while healing.   INFLAMED SEBORRHEIC KERATOSIS (3) Right Forearm x1, L post thigh x1, R temple x1 (3) Symptomatic, irritating, patient would like treated. Destruction of lesion - Right Forearm x1, L post thigh x1, R temple x1 (3)  Destruction method: cryotherapy   Informed consent: discussed and consent obtained   Lesion destroyed using liquid nitrogen: Yes   Region frozen until ice ball extended beyond lesion: Yes   Outcome: patient tolerated procedure well with no complications   Post-procedure details: wound care instructions given   Additional details:  Prior to procedure, discussed risks of  blister formation, small wound, skin dyspigmentation, or rare scar following cryotherapy. Recommend Vaseline ointment to treated areas while healing.    Return in about 1 year (around 03/18/2025) for TBSE, HxBCC, HxSCC.  I, Jill Parcell, CMA, am acting as scribe for Lauraine JAYSON Kanaris, MD.   Documentation: I have reviewed the above documentation for accuracy and completeness, and I agree with the above.  Lauraine JAYSON Kanaris, MD

## 2024-08-19 ENCOUNTER — Other Ambulatory Visit: Payer: Self-pay | Admitting: Family Medicine

## 2024-08-19 DIAGNOSIS — Z1231 Encounter for screening mammogram for malignant neoplasm of breast: Secondary | ICD-10-CM

## 2024-09-17 ENCOUNTER — Encounter

## 2025-03-18 ENCOUNTER — Ambulatory Visit
